# Patient Record
Sex: Male | Born: 1995 | Race: White | Hispanic: No | Marital: Single | Smoking: Current every day smoker
Health system: Southern US, Community
[De-identification: ages and names within clinical notes are randomized; demographics above are authoritative.]

## PROBLEM LIST (undated history)

## (undated) DIAGNOSIS — J45909 Unspecified asthma, uncomplicated: Secondary | ICD-10-CM

## (undated) DIAGNOSIS — D6800 Von Willebrand disease, unspecified: Secondary | ICD-10-CM

## (undated) HISTORY — PX: TONSILLECTOMY: SUR1361

---
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
## 2016-10-12 DIAGNOSIS — J45909 Unspecified asthma, uncomplicated: Secondary | ICD-10-CM | POA: Diagnosis not present

## 2016-10-12 DIAGNOSIS — K0889 Other specified disorders of teeth and supporting structures: Secondary | ICD-10-CM | POA: Diagnosis present

## 2016-10-12 DIAGNOSIS — K029 Dental caries, unspecified: Secondary | ICD-10-CM | POA: Diagnosis not present

## 2016-10-12 DIAGNOSIS — F17210 Nicotine dependence, cigarettes, uncomplicated: Secondary | ICD-10-CM | POA: Insufficient documentation

## 2016-10-12 DIAGNOSIS — K047 Periapical abscess without sinus: Secondary | ICD-10-CM | POA: Diagnosis not present

## 2016-10-13 ENCOUNTER — Encounter (HOSPITAL_COMMUNITY): Payer: Self-pay

## 2016-10-13 ENCOUNTER — Emergency Department (HOSPITAL_COMMUNITY)
Admission: EM | Admit: 2016-10-13 | Discharge: 2016-10-13 | Disposition: A | Discharge status: Home / Self Care | Payer: Medicaid Other | Attending: Emergency Medicine | Admitting: Emergency Medicine

## 2016-10-13 DIAGNOSIS — R519 Headache, unspecified: Secondary | ICD-10-CM

## 2016-10-13 DIAGNOSIS — K029 Dental caries, unspecified: Secondary | ICD-10-CM

## 2016-10-13 DIAGNOSIS — K047 Periapical abscess without sinus: Secondary | ICD-10-CM

## 2016-10-13 DIAGNOSIS — R51 Headache: Secondary | ICD-10-CM

## 2016-10-13 HISTORY — DX: Unspecified asthma, uncomplicated: J45.909

## 2016-10-13 MED ORDER — CLINDAMYCIN HCL 150 MG PO CAPS
300.0000 mg | ORAL_CAPSULE | Freq: Once | ORAL | Status: AC
  Administered 2016-10-13: 300 mg via ORAL
  Filled 2016-10-13: qty 2

## 2016-10-13 MED ORDER — OXYCODONE-ACETAMINOPHEN 5-325 MG PO TABS: 1.0000 | ORAL_TABLET | ORAL | 0 refills | Status: AC | PRN

## 2016-10-13 MED ORDER — OXYCODONE-ACETAMINOPHEN 5-325 MG PO TABS
1.0000 | ORAL_TABLET | Freq: Once | ORAL | Status: AC
  Administered 2016-10-13: 1 via ORAL

## 2016-10-13 MED ORDER — OXYCODONE-ACETAMINOPHEN 5-325 MG PO TABS
1.0000 | ORAL_TABLET | ORAL | Status: DC | PRN
  Administered 2016-10-13: 1 via ORAL
  Filled 2016-10-13: qty 1

## 2016-10-13 MED ORDER — CLINDAMYCIN HCL 150 MG PO CAPS: 300.0000 mg | ORAL_CAPSULE | Freq: Three times a day (TID) | ORAL | 0 refills | Status: AC

## 2016-10-13 MED ORDER — OXYCODONE-ACETAMINOPHEN 5-325 MG PO TABS
ORAL_TABLET | ORAL | Status: AC
  Filled 2016-10-13: qty 1

## 2016-10-13 NOTE — Discharge Instructions (Signed)
Call your dentist tomorrow to schedule a recheck appointment for further management of infection. Return here as needed. Stop taking Amoxil and start Clindamycin as directed.

## 2016-10-13 NOTE — ED Provider Notes (Signed)
[facility]-EMERGENCY DEPT Provider Note   CSN: [id number] Arrival date & time: 10/12/16  2358     History   Chief Complaint Chief Complaint  Patient presents with  . Dental Pain    HPI Jimmy Leon is a 21 y.o. male.  Patient presents with pain from known dental abscess in right lower jaw. No fever. He was started on Amoxil on January 10th by his dentist and has had worsening facial swelling and dental pain. No difficulty swallowing. No SOB, nausea or vomiting. He has not had a re-visit with his dentist since starting Amoxil. Plan is dental extraction when pain is under control and surgical plan in place given his VonWilebrand's Dz.    The history is provided by the patient and a parent. No language interpreter was used.  Dental Pain      Past Medical History:  Diagnosis Date  . Asthma   . Von Willebrand disease (HCC)     There are no active problems to display for this patient.   Past Surgical History:  Procedure Laterality Date  . TONSILLECTOMY         Home Medications    Prior to Admission medications   Not on File    Family History History reviewed. No pertinent family history.  Social History Social History  Substance Use Topics  . Smoking status: Current Every Day Smoker    Packs/day: 1.00    Types: Cigarettes  . Smokeless tobacco: Never Used  . Alcohol use No     Allergies   Sulfa antibiotics   Review of Systems Review of Systems  Constitutional: Negative for fever.  HENT: Positive for dental problem. Negative for sore throat and trouble swallowing.   Respiratory: Negative for shortness of breath.   Musculoskeletal: Negative for neck pain.  Skin: Negative for color change and wound.     Physical Exam Updated Vital Signs BP 160/95 (BP Location: Right Arm)   Pulse 71   Temp 98.6 F (37 C) (Oral)   Resp 18   Ht 5\' 11"  (1.803 m)   Wt 71.7 kg   SpO2 100%   BMI 22.04 kg/m   Physical Exam  Constitutional: He is oriented to person,  place, and time. He appears well-developed and well-nourished.  HENT:  Mild swelling to right mandible at angle. No palpable abscess. Poor dentition with significant decay to #'s 31, 32.   Neck: Normal range of motion.  Pulmonary/Chest: Effort normal. No respiratory distress.  Musculoskeletal: Normal range of motion.  Neurological: He is alert and oriented to person, place, and time.  Skin: Skin is warm and dry.  Psychiatric: He has a normal mood and affect.     ED Treatments / Results  Labs (all labs ordered are listed, but only abnormal results are displayed) Labs Reviewed - No data to display  EKG  EKG Interpretation None       Radiology No results found.  Procedures Procedures (including critical care time)  Medications Ordered in ED Medications  oxyCODONE-acetaminophen (PERCOCET/ROXICET) 5-325 MG per tablet 1 tablet (1 tablet Oral Given 10/13/16 0012)  oxyCODONE-acetaminophen (PERCOCET/ROXICET) 5-325 MG per tablet (not administered)  clindamycin (CLEOCIN) capsule 300 mg (not administered)  oxyCODONE-acetaminophen (PERCOCET/ROXICET) 5-325 MG per tablet 1 tablet (not administered)     Initial Impression / Assessment and Plan / ED Course  I have reviewed the triage vital signs and the nursing notes.  Pertinent labs & imaging results that were available during my care of the patient were reviewed by  me and considered in my medical decision making (see chart for details).  Clinical Course     Patient with right facial swelling with known dental abscess being followed by dentistry. No fever. No bleeding. Will change antibiotics from Amoxil to Clindamycin and provide pain relief. He is encouraged to see his dentist this week.   Final Clinical Impressions(s) / ED Diagnoses   Final diagnoses:  None  1. Facial pain 2. Dental abscess  New Prescriptions New Prescriptions   No medications on file     Elpidio AnisShari Tegan Britain, Cordelia Poche-C 10/13/16 [id number]    Gilda Creasehristopher J Pollina,  MD 10/14/16 [phone]

## 2016-10-13 NOTE — ED Notes (Signed)
Shari Upstill, PA at bedside at this time.  

## 2016-10-13 NOTE — ED Triage Notes (Signed)
Pt endorses right lower dental pain with facial swelling. Pt went to dentist last week and was prescribed antibiotics and pt is waiting to be scheduled for tooth removal but pt's pain is progressively worse. Denies fever and chills.
